# Patient Record
Sex: Male | Born: 1975 | Race: White | Hispanic: No | Marital: Married | Smoking: Never smoker
Health system: Southern US, Community
[De-identification: ages and names within clinical notes are randomized; demographics above are authoritative.]

## PROBLEM LIST (undated history)

## (undated) DIAGNOSIS — M109 Gout, unspecified: Secondary | ICD-10-CM

## (undated) HISTORY — DX: Gout, unspecified: M10.9

## (undated) HISTORY — PX: NO PAST SURGERIES: SHX2092

---

## 2020-05-09 ENCOUNTER — Encounter (HOSPITAL_COMMUNITY): Payer: Self-pay | Admitting: Emergency Medicine

## 2020-05-09 ENCOUNTER — Other Ambulatory Visit: Payer: Self-pay

## 2020-05-09 ENCOUNTER — Emergency Department (HOSPITAL_COMMUNITY): Payer: Managed Care, Other (non HMO)

## 2020-05-09 ENCOUNTER — Emergency Department (HOSPITAL_COMMUNITY)
Admission: EM | Admit: 2020-05-09 | Discharge: 2020-05-10 | Disposition: A | Discharge status: Home / Self Care | Payer: Managed Care, Other (non HMO) | Attending: Emergency Medicine | Admitting: Emergency Medicine

## 2020-05-09 DIAGNOSIS — E860 Dehydration: Secondary | ICD-10-CM | POA: Insufficient documentation

## 2020-05-09 DIAGNOSIS — R112 Nausea with vomiting, unspecified: Secondary | ICD-10-CM | POA: Diagnosis not present

## 2020-05-09 DIAGNOSIS — Z79899 Other long term (current) drug therapy: Secondary | ICD-10-CM | POA: Diagnosis not present

## 2020-05-09 DIAGNOSIS — R197 Diarrhea, unspecified: Secondary | ICD-10-CM | POA: Diagnosis not present

## 2020-05-09 DIAGNOSIS — U071 COVID-19: Secondary | ICD-10-CM | POA: Diagnosis not present

## 2020-05-09 DIAGNOSIS — R509 Fever, unspecified: Secondary | ICD-10-CM | POA: Diagnosis present

## 2020-05-09 LAB — CBC WITH DIFFERENTIAL/PLATELET
Abs Immature Granulocytes: 0.01 10*3/uL (ref 0.00–0.07)
Basophils Absolute: 0 10*3/uL (ref 0.0–0.1)
Basophils Relative: 0 %
Eosinophils Absolute: 0 10*3/uL (ref 0.0–0.5)
Eosinophils Relative: 0 %
HCT: 48.4 % (ref 39.0–52.0)
Hemoglobin: 15.1 g/dL (ref 13.0–17.0)
Immature Granulocytes: 0 %
Lymphocytes Relative: 24 %
Lymphs Abs: 0.9 10*3/uL (ref 0.7–4.0)
MCH: 25.6 pg — ABNORMAL LOW (ref 26.0–34.0)
MCHC: 31.2 g/dL (ref 30.0–36.0)
MCV: 82 fL (ref 80.0–100.0)
Monocytes Absolute: 0.2 10*3/uL (ref 0.1–1.0)
Monocytes Relative: 5 %
Neutro Abs: 2.8 10*3/uL (ref 1.7–7.7)
Neutrophils Relative %: 71 %
Platelets: 180 10*3/uL (ref 150–400)
RBC: 5.9 MIL/uL — ABNORMAL HIGH (ref 4.22–5.81)
RDW: 14.2 % (ref 11.5–15.5)
WBC: 3.9 10*3/uL — ABNORMAL LOW (ref 4.0–10.5)
nRBC: 0 % (ref 0.0–0.2)

## 2020-05-09 MED ORDER — ONDANSETRON HCL 4 MG/2ML IJ SOLN
4.0000 mg | Freq: Once | INTRAMUSCULAR | Status: AC
  Administered 2020-05-09: 4 mg via INTRAVENOUS
  Filled 2020-05-09: qty 2

## 2020-05-09 MED ORDER — SODIUM CHLORIDE 0.9% FLUSH
3.0000 mL | Freq: Once | INTRAVENOUS | Status: AC
  Administered 2020-05-09: 3 mL via INTRAVENOUS

## 2020-05-09 MED ORDER — ACETAMINOPHEN 325 MG PO TABS
650.0000 mg | ORAL_TABLET | Freq: Once | ORAL | Status: AC | PRN
  Administered 2020-05-09: 650 mg via ORAL
  Filled 2020-05-09: qty 2

## 2020-05-09 MED ORDER — SODIUM CHLORIDE 0.9 % IV BOLUS
1000.0000 mL | Freq: Once | INTRAVENOUS | Status: AC
  Administered 2020-05-09: 1000 mL via INTRAVENOUS

## 2020-05-09 NOTE — ED Provider Notes (Signed)
[facility] DEPT Provider Note: Georgena Spurling, MD, FACEP  CSN: [id number] MRN: [id number] ARRIVAL: 05/09/20 at 2227 ROOM: [place]  Fever   HISTORY OF PRESENT ILLNESS  05/09/20 11:18 PM Phillip Mosley is a 44 y.o. male with a 1 week history of fever, chills, cough, shortness of breath, nausea, vomiting and diarrhea.  Symptoms have persisted and he is feeling worse tonight.  Although he rated associated body aches is a 6 out of 10 in triage he tells me he does not have body aches.  He feels weak and dehydrated.  He has been taking Tylenol for his fever, last dose at 2 PM; he was given a dose on arrival.  He was diagnosed as Covid positive at [facility] 4 days ago.  He denies abdominal pain.  He is having some sore throat when he coughs.   History reviewed. No pertinent past medical history.  History reviewed. No pertinent surgical history.  No family history on file.  Social History   Tobacco Use  . Smoking status: Never Smoker  . Smokeless tobacco: Never Used  Substance Use Topics  . Alcohol use: Yes  . Drug use: Never    Prior to Admission medications   Medication Sig Start Date End Date Taking? Authorizing Provider  Ascorbic Acid (VITAMIN C) 100 MG tablet Take 100 mg by mouth daily.   Yes [provider]  cholecalciferol (VITAMIN D3) 25 MCG (1000 UNIT) tablet Take 1,000 Units by mouth daily.   Yes [provider]  magnesium 30 MG tablet Take 30 mg by mouth daily.   Yes [provider]  zinc sulfate 220 (50 Zn) MG capsule Take 220 mg by mouth daily.   Yes [provider]    Allergies Patient has no known allergies.   REVIEW OF SYSTEMS  Negative except as noted here or in the History of Present Illness.   PHYSICAL EXAMINATION  Initial Vital Signs Blood pressure (!) 142/86, pulse (!) 119, temperature (!) 102.1 F (38.9 C), temperature source Oral, resp. rate 17, height 5\' 10"  (1.778 m), weight 120.2 kg,  SpO2 98 %.  Examination General: Well-developed, well-nourished male in no acute distress; appearance consistent with age of record HENT: normocephalic; atraumatic Eyes: pupils equal, round and reactive to light; extraocular muscles intact Neck: supple Heart: regular rate and rhythm; tachycardia Lungs: Decreased air movement bilaterally; no tachypnea; no wheezing Abdomen: soft; nondistended; nontender; bowel sounds present Extremities: No deformity; full range of motion; pulses normal Neurologic: Awake, alert and oriented; motor function intact in all extremities and symmetric; no facial droop Skin: Warm and dry Psychiatric: Flat affect   RESULTS  Summary of this visit's results, reviewed and interpreted by myself:   EKG Interpretation  Date/Time:    Ventricular Rate:    PR Interval:    QRS Duration:   QT Interval:    QTC Calculation:   R Axis:     Text Interpretation:        Laboratory Studies: Results for orders placed or performed during the hospital encounter of 05/09/20 (from the past 24 hour(s))  Lactic acid, plasma     Status: None   Collection Time: 05/09/20 11:08 PM  Result Value Ref Range   Lactic Acid, Venous 1.1 0.5 - 1.9 mmol/L  Comprehensive metabolic panel     Status: Abnormal   Collection Time: 05/09/20 11:08 PM  Result Value Ref Range   Sodium 135 135 - 145 mmol/L   Potassium 4.6 3.5 - 5.1  mmol/L   Chloride 99 98 - 111 mmol/L   CO2 24 22 - 32 mmol/L   Glucose, Bld 103 (H) 70 - 99 mg/dL   BUN 18 6 - 20 mg/dL   Creatinine, Ser 1.21 0.61 - 1.24 mg/dL   Calcium 8.4 (L) 8.9 - 10.3 mg/dL   Total Protein 7.5 6.5 - 8.1 g/dL   Albumin 3.9 3.5 - 5.0 g/dL   AST 73 (H) 15 - 41 U/L   ALT 61 (H) 0 - 44 U/L   Alkaline Phosphatase 67 38 - 126 U/L   Total Bilirubin 0.8 0.3 - 1.2 mg/dL   GFR calc non Af Amer >60 >60 mL/min   GFR calc Af Amer >60 >60 mL/min   Anion gap 12 5 - 15  CBC with Differential     Status: Abnormal   Collection Time: 05/09/20 11:08 PM    Result Value Ref Range   WBC 3.9 (L) 4.0 - 10.5 K/uL   RBC 5.90 (H) 4.22 - 5.81 MIL/uL   Hemoglobin 15.1 13.0 - 17.0 g/dL   HCT 48.4 39.0 - 52.0 %   MCV 82.0 80.0 - 100.0 fL   MCH 25.6 (L) 26.0 - 34.0 pg   MCHC 31.2 30.0 - 36.0 g/dL   RDW 14.2 11.5 - 15.5 %   Platelets 180 150 - 400 K/uL   nRBC 0.0 0.0 - 0.2 %   Neutrophils Relative % 71 %   Neutro Abs 2.8 1.7 - 7.7 K/uL   Lymphocytes Relative 24 %   Lymphs Abs 0.9 0.7 - 4.0 K/uL   Monocytes Relative 5 %   Monocytes Absolute 0.2 0.1 - 1.0 K/uL   Eosinophils Relative 0 %   Eosinophils Absolute 0.0 0.0 - 0.5 K/uL   Basophils Relative 0 %   Basophils Absolute 0.0 0.0 - 0.1 K/uL   Immature Granulocytes 0 %   Abs Immature Granulocytes 0.01 0.00 - 0.07 K/uL  SARS Coronavirus 2 by RT PCR (hospital order, performed in [place] hospital lab) Nasopharyngeal Nasopharyngeal Swab     Status: Abnormal   Collection Time: 05/09/20 11:48 PM   Specimen: Nasopharyngeal Swab  Result Value Ref Range   SARS Coronavirus 2 POSITIVE (A) NEGATIVE   Imaging Studies: DG Chest Port 1 View  Result Date: 05/09/2020 CLINICAL DATA:  COVID-19 positive, nausea, vomiting, diarrhea for 5 days EXAM: PORTABLE CHEST 1 VIEW COMPARISON:  None. FINDINGS: Single frontal view of the chest demonstrates an unremarkable cardiac silhouette. Lung volumes are diminished, with no airspace disease, effusion, or pneumothorax. No acute bony abnormality. IMPRESSION: 1. Low lung volumes, no acute process. Electronically Signed   By: Randa Ngo M.D.   On: 05/09/2020 23:26    ED COURSE and MDM  Nursing notes, initial and subsequent vitals signs, including pulse oximetry, reviewed and interpreted by myself.  Vitals:   05/10/20 0000 05/10/20 0100 05/10/20 0215 05/10/20 0221  BP: 132/79 125/71 128/85   Pulse: (!) 101 92 93   Resp: (!) 21 (!) 26 (!) 27   Temp:   99 F (37.2 C)   TempSrc:      SpO2: 93% 94% 92% 95%  Weight:      Height:       Medications  albuterol  (VENTOLIN HFA) 108 (90 Base) MCG/ACT inhaler 2 puff (2 puffs Inhalation Given 05/10/20 0221)  sodium chloride flush (NS) 0.9 % injection 3 mL (3 mLs Intravenous Given 05/09/20 2346)  acetaminophen (TYLENOL) tablet 650 mg (650 mg Oral Given 05/09/20  2342)  ondansetron (ZOFRAN) injection 4 mg (4 mg Intravenous Given 05/09/20 2343)  sodium chloride 0.9 % bolus 1,000 mL (1,000 mLs Intravenous New Bag/Given 05/09/20 2342)  sodium chloride 0.9 % bolus 1,000 mL (1,000 mLs Intravenous New Bag/Given 05/10/20 0222)   3:28 AM Patient feeling better after 2 L bolus and IV Zofran.  Patient also given albuterol inhaler and instructed in its use.  He complains of a persistent cough but lungs remain clear and chest x-ray shows no infiltrates.  I do not believe admission is needed at this time.   PROCEDURES  Procedures   ED DIAGNOSES     ICD-10-CM   1. COVID-19  U07.1 DG Chest Port 1 View    DG Chest Port 1 View  2. Nausea vomiting and diarrhea  R11.2    R19.7   3. Dehydration  E86.0        Myking Sar, Jenny Reichmann, MD 05/10/20 [id number]

## 2020-05-09 NOTE — ED Triage Notes (Signed)
Pt presents with dx of COVID on Saturday and has been having fever, nausea, vomiting and diarrhea since then. Last tylenol at 1400.

## 2020-05-10 LAB — COMPREHENSIVE METABOLIC PANEL
ALT: 61 U/L — ABNORMAL HIGH (ref 0–44)
AST: 73 U/L — ABNORMAL HIGH (ref 15–41)
Albumin: 3.9 g/dL (ref 3.5–5.0)
Alkaline Phosphatase: 67 U/L (ref 38–126)
Anion gap: 12 (ref 5–15)
BUN: 18 mg/dL (ref 6–20)
CO2: 24 mmol/L (ref 22–32)
Calcium: 8.4 mg/dL — ABNORMAL LOW (ref 8.9–10.3)
Chloride: 99 mmol/L (ref 98–111)
Creatinine, Ser: 1.21 mg/dL (ref 0.61–1.24)
GFR calc Af Amer: >60 mL/min (ref >60)
GFR calc non Af Amer: >60 mL/min (ref >60)
Glucose, Bld: 103 mg/dL — ABNORMAL HIGH (ref 70–99)
Potassium: 4.6 mmol/L (ref 3.5–5.1)
Sodium: 135 mmol/L (ref 135–145)
Total Bilirubin: 0.8 mg/dL (ref 0.3–1.2)
Total Protein: 7.5 g/dL (ref 6.5–8.1)

## 2020-05-10 LAB — SARS CORONAVIRUS 2 BY RT PCR (HOSPITAL ORDER, PERFORMED IN ~~LOC~~ HOSPITAL LAB): SARS Coronavirus 2: POSITIVE — AB

## 2020-05-10 LAB — LACTIC ACID, PLASMA: Lactic Acid, Venous: 1.1 mmol/L (ref 0.5–1.9)

## 2020-05-10 MED ORDER — HYDROCOD POLST-CPM POLST ER 10-8 MG/5ML PO SUER
5.0000 mL | Freq: Once | ORAL | Status: AC
  Administered 2020-05-10: 5 mL via ORAL
  Filled 2020-05-10: qty 5

## 2020-05-10 MED ORDER — HYDROCOD POLST-CPM POLST ER 10-8 MG/5ML PO SUER: 5.0000 mL | Freq: Two times a day (BID) | ORAL | 0 refills | Status: DC | PRN

## 2020-05-10 MED ORDER — ALBUTEROL SULFATE HFA 108 (90 BASE) MCG/ACT IN AERS
2.0000 | INHALATION_SPRAY | RESPIRATORY_TRACT | Status: DC | PRN
  Administered 2020-05-10: 2 via RESPIRATORY_TRACT
  Filled 2020-05-10: qty 6.7

## 2020-05-10 MED ORDER — SODIUM CHLORIDE 0.9 % IV BOLUS
1000.0000 mL | Freq: Once | INTRAVENOUS | Status: AC
  Administered 2020-05-10: 1000 mL via INTRAVENOUS

## 2020-05-10 MED ORDER — ONDANSETRON 8 MG PO TBDP: 8.0000 mg | ORAL_TABLET | Freq: Three times a day (TID) | ORAL | 1 refills | Status: DC | PRN

## 2020-05-11 ENCOUNTER — Telehealth: Payer: Self-pay | Admitting: Nurse Practitioner

## 2020-05-11 ENCOUNTER — Other Ambulatory Visit: Payer: Self-pay | Admitting: Unknown Physician Specialty

## 2020-05-11 ENCOUNTER — Telehealth: Payer: Self-pay | Admitting: Unknown Physician Specialty

## 2020-05-11 NOTE — Telephone Encounter (Signed)
Called to discuss with patient about Covid symptoms and the use of bamlanivimab, a monoclonal antibody infusion for those with mild to moderate Covid symptoms and at a high risk of hospitalization.  Pt is qualified for this infusion at the [facility] infusion center due to BMI>35   Unable to leave message as mailbox is full.  Emergency number not in service

## 2020-05-11 NOTE — Telephone Encounter (Signed)
Called to discuss with Francene Castle about Covid symptoms and the use of bamlanivimab combination, a monoclonal antibody infusion for those with mild to moderate Covid symptoms and at a high risk of hospitalization.     Pt is qualified for this infusion at the [facility] infusion center due to co-morbid conditions and/or a member of an at-risk group (BMI >35).   Unable to reach patient, voicemail is full.   Alda Lea, AGPCNP-BC Pager: [phone] Amion: Bjorn Pippin

## 2021-04-22 IMAGING — DX DG CHEST 1V PORT
1 series · 1 of 1 positions shown · non-contrast
Comparison: None.

CLINICAL DATA: [id number] positive, nausea, vomiting, diarrhea for 5
days

EXAM:
PORTABLE CHEST 1 VIEW

[chest ap]
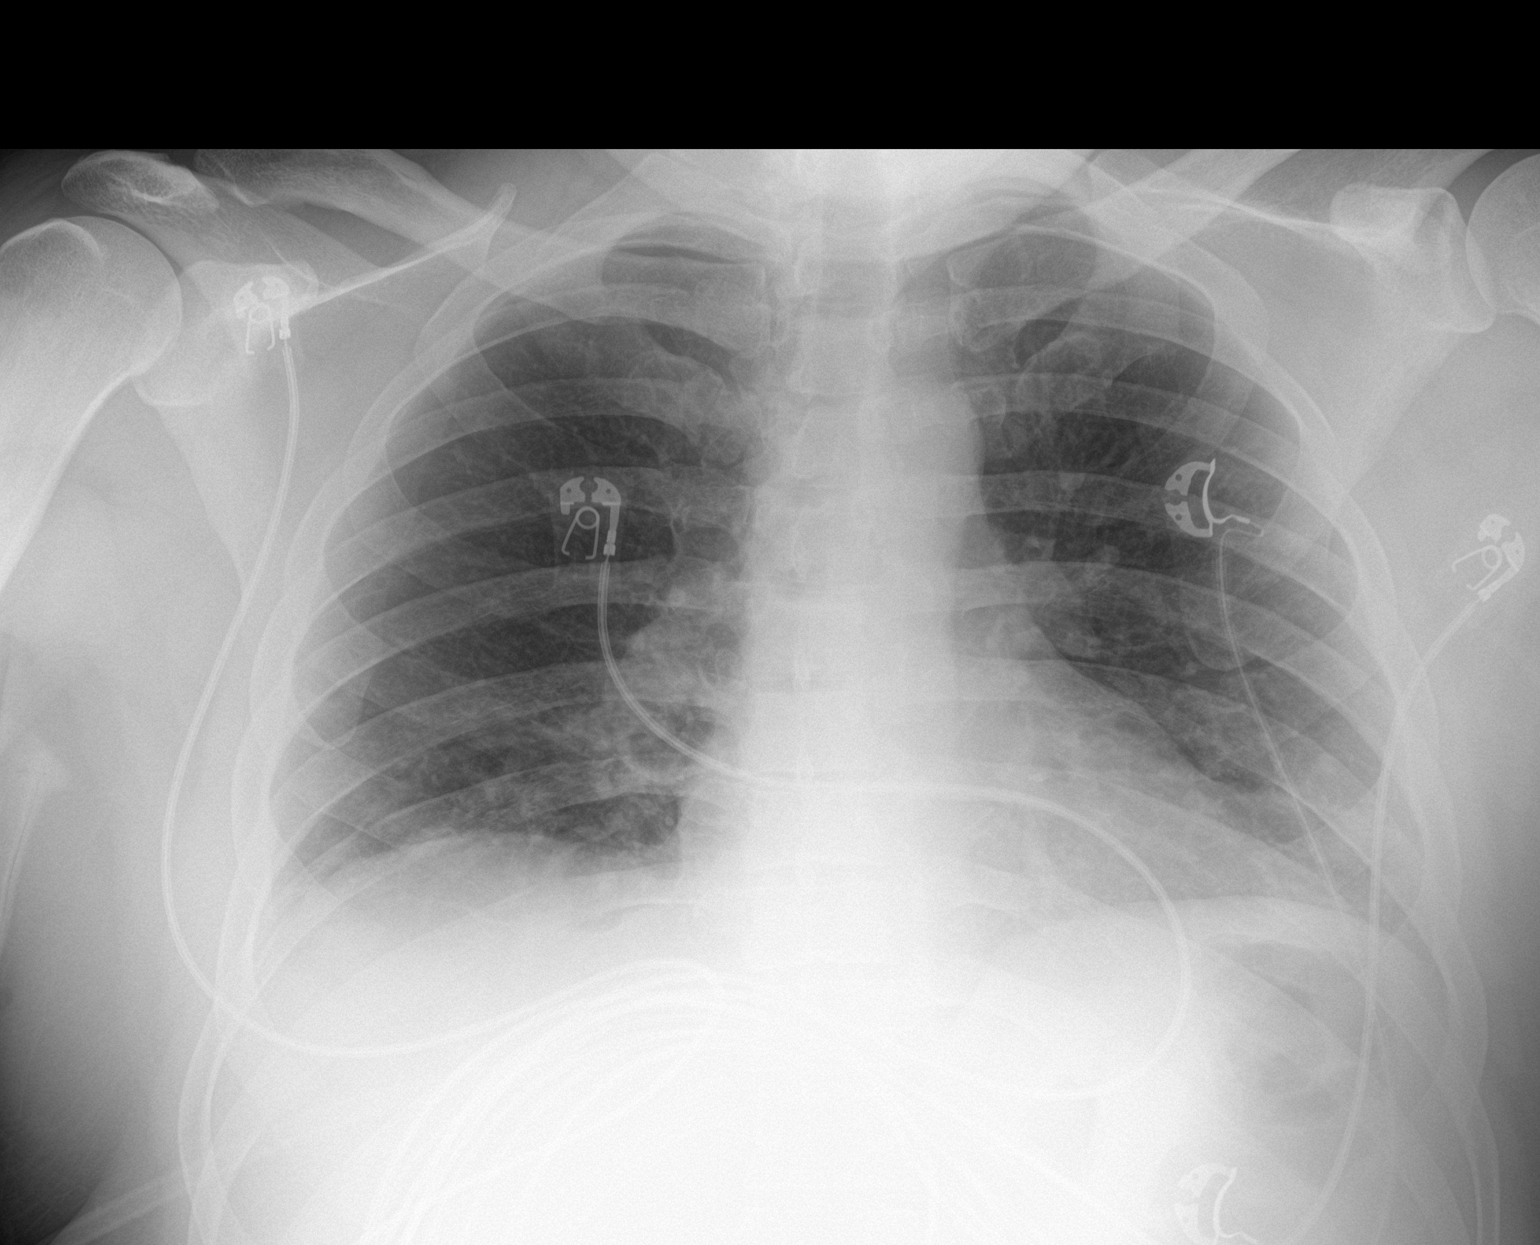

[1 of 1 positions shown; findings below may reference images not displayed]

FINDINGS: Single frontal view of the chest demonstrates an unremarkable
cardiac silhouette. Lung volumes are diminished, with no airspace
disease, effusion, or pneumothorax. No acute bony abnormality.
IMPRESSION: 1. Low lung volumes, no acute process.

## 2021-04-23 ENCOUNTER — Encounter: Payer: Self-pay | Admitting: Gastroenterology

## 2021-05-20 ENCOUNTER — Encounter: Payer: Self-pay | Admitting: Gastroenterology

## 2021-05-20 ENCOUNTER — Ambulatory Visit: Payer: Managed Care, Other (non HMO) | Admitting: Gastroenterology

## 2021-05-20 ENCOUNTER — Other Ambulatory Visit: Payer: Self-pay

## 2021-05-20 VITALS — BP 118/88 | HR 84 | Ht 70.0 in | Wt 268.8 lb

## 2021-05-20 DIAGNOSIS — K625 Hemorrhage of anus and rectum: Secondary | ICD-10-CM | POA: Diagnosis not present

## 2021-05-20 DIAGNOSIS — K5900 Constipation, unspecified: Secondary | ICD-10-CM | POA: Diagnosis not present

## 2021-05-20 MED ORDER — NA SULFATE-K SULFATE-MG SULF 17.5-3.13-1.6 GM/177ML PO SOLN: 1.0000 | Freq: Once | ORAL | 0 refills | Status: AC

## 2021-05-20 NOTE — Patient Instructions (Signed)
If you are age 43 or older, your body mass index should be between 23-30. Your Body mass index is 38.57 kg/m. If this is out of the aforementioned range listed, please consider follow up with your Primary Care Provider.  If you are age 48 or younger, your body mass index should be between 19-25. Your Body mass index is 38.57 kg/m. If this is out of the aformentioned range listed, please consider follow up with your Primary Care Provider.   You have been scheduled for a colonoscopy. Please follow written instructions given to you at your visit today.  Please pick up your prep supplies at the pharmacy within the next 1-3 days. If you use inhalers (even only as needed), please bring them with you on the day of your procedure.  Due to recent changes in healthcare laws, you may see the results of your imaging and laboratory studies on MyChart before your provider has had a chance to review them.  We understand that in some cases there may be results that are confusing or concerning to you. Not all laboratory results come back in the same time frame and the provider may be waiting for multiple results in order to interpret others.  Please give [place] 48 hours in order for your provider to thoroughly review all the results before contacting the office for clarification of your results.   The [facility] providers would like to encourage you to use [facility] to communicate with providers for non-urgent requests or questions.  Due to long hold times on the telephone, sending your provider a message by [facility] may be a faster and more efficient way to get a response.  Please allow 48 business hours for a response.  Please remember that this is for non-urgent requests.

## 2021-05-20 NOTE — Progress Notes (Signed)
05/20/2021 Phillip Mosley [id number] 07/10/1976   HISTORY OF PRESENT ILLNESS: This is a 45 year old male who is new to our office.  He has been referred here by Shanon Rosser, PA-C, for evaluation regarding rectal bleeding.  He tells me that he has been seeing bright red color blood in his stools/with bowel movements for the past 6 months.  He says that he does have to strain to move his bowels and has a bowel movement every couple of days.  He started stool softeners a few weeks ago and they seem to help to some degree.  He has never had colonoscopy in the past.  Recent CBC showed a normal hemoglobin at 14.6 g.  He denies any abdominal pain.  He has family history of colon cancer in his maternal grandfather.   Past Medical History:  Diagnosis Date  . Gout    Past Surgical History:  Procedure Laterality Date  . NO PAST SURGERIES      reports that he has never smoked. He has never used smokeless tobacco. He reports current alcohol use. He reports that he does not use drugs. family history includes Colon cancer in his maternal grandfather. No Known Allergies    Outpatient Encounter Medications as of 05/20/2021  Medication Sig  . allopurinol (ZYLOPRIM) 100 MG tablet Take 100 mg by mouth daily.  Mariane Baumgarten Sodium (STOOL SOFTENER LAXATIVE PO) Take 1 capsule by mouth as needed.  . [DISCONTINUED] Ascorbic Acid (VITAMIN C) 100 MG tablet Take 100 mg by mouth daily.  . [DISCONTINUED] chlorpheniramine-HYDROcodone (TUSSIONEX PENNKINETIC ER) 10-8 MG/5ML SUER Take 5 mLs by mouth every 12 (twelve) hours as needed for cough.  . [DISCONTINUED] cholecalciferol (VITAMIN D3) 25 MCG (1000 UNIT) tablet Take 1,000 Units by mouth daily.  . [DISCONTINUED] magnesium 30 MG tablet Take 30 mg by mouth daily.  . [DISCONTINUED] ondansetron (ZOFRAN ODT) 8 MG disintegrating tablet Take 1 tablet (8 mg total) by mouth every 8 (eight) hours as needed for nausea or vomiting.  . [DISCONTINUED] zinc sulfate 220 (50 Zn)  MG capsule Take 220 mg by mouth daily.   No facility-administered encounter medications on file as of 05/20/2021.    REVIEW OF SYSTEMS  : All other systems reviewed and negative except where noted in the History of Present Illness.   PHYSICAL EXAM: BP 118/88   Pulse 84   Ht 5\' 10"  (1.778 m)   Wt 268 lb 12.8 oz (121.9 kg)   SpO2 99%   BMI 38.57 kg/m  General: Well developed white male in no acute distress Head: Normocephalic and atraumatic Eyes:  Sclerae anicteric, conjunctiva pink. Ears: Normal auditory acuity Lungs: Clear throughout to auscultation; no W/R/R. Heart: Regular rate and rhythm; no M/R/G. Abdomen: Soft, non-distended.  BS present.  Non-tender. Rectal:  Will be done at the time of colonoscopy. Musculoskeletal: Symmetrical with no gross deformities  Skin: No lesions on visible extremities Extremities: No edema  Neurological: Alert oriented x 4, grossly non-focal Psychological:  Alert and cooperative. Normal mood and affect  ASSESSMENT AND PLAN: *Rectal bleeding: Sees frequent bright red blood with bowel movements.  Sounds like outlet bleeding, possibly internal hemorrhoids.  We will plan for colonoscopy to rule out other etiologies as well.  This is being scheduled with Dr. Lyndel Safe.  The risks, benefits, and alternatives to colonoscopy were discussed with the patient and he consents to proceed.  *Constipation: Continue stool softeners if they seem to be helping.  We also discussed increasing water/liquids in his diet  and fruits/vegetables such as salads and "P" fruits including plums, peaches, pears.   CC:  Long, Del Muerto, PA-C

## 2021-05-29 NOTE — Progress Notes (Signed)
Agree with assessment/plan RG 

## 2021-05-31 ENCOUNTER — Ambulatory Visit (AMBULATORY_SURGERY_CENTER): Payer: Managed Care, Other (non HMO) | Admitting: Gastroenterology

## 2021-05-31 ENCOUNTER — Other Ambulatory Visit: Payer: Self-pay

## 2021-05-31 ENCOUNTER — Encounter: Payer: Self-pay | Admitting: Gastroenterology

## 2021-05-31 VITALS — BP 117/75 | HR 69 | Temp 97.7°F | Resp 17 | Ht 70.0 in | Wt 268.0 lb

## 2021-05-31 DIAGNOSIS — K648 Other hemorrhoids: Secondary | ICD-10-CM

## 2021-05-31 DIAGNOSIS — K5900 Constipation, unspecified: Secondary | ICD-10-CM | POA: Diagnosis not present

## 2021-05-31 DIAGNOSIS — D127 Benign neoplasm of rectosigmoid junction: Secondary | ICD-10-CM

## 2021-05-31 DIAGNOSIS — K625 Hemorrhage of anus and rectum: Secondary | ICD-10-CM

## 2021-05-31 MED ORDER — SODIUM CHLORIDE 0.9 % IV SOLN
500.0000 mL | Freq: Once | INTRAVENOUS | Status: DC
Start: 2021-05-31 — End: 2021-05-31

## 2021-05-31 NOTE — Op Note (Signed)
[facility] Patient Name: Phillip Mosley Procedure Date: 05/31/2021 10:40 AM MRN: [id number] Endoscopist: Jackquline Denmark , MD Age: 45 Referring MD:  Date of Birth: 12-04-1976 Gender: Male Account #: [id number] Procedure:                Colonoscopy Indications:              Rectal bleeding. H/O constipation. Family history                            of colon cancer in a second-degree relative. Medicines:                Monitored Anesthesia Care Procedure:                Pre-Anesthesia Assessment:                           - Prior to the procedure, a History and Physical                            was performed, and patient medications and                            allergies were reviewed. The patient's tolerance of                            previous anesthesia was also reviewed. The risks                            and benefits of the procedure and the sedation                            options and risks were discussed with the patient.                            All questions were answered, and informed consent                            was obtained. Prior Anticoagulants: The patient has                            taken no previous anticoagulant or antiplatelet                            agents. ASA Grade Assessment: II - A patient with                            mild systemic disease. After reviewing the risks                            and benefits, the patient was deemed in                            satisfactory condition to undergo the procedure.  After obtaining informed consent, the colonoscope                            was passed under direct vision. Throughout the                            procedure, the patient's blood pressure, pulse, and                            oxygen saturations were monitored continuously. The                            Olympus [id number] (#[id number]) Colonoscope was                            introduced through the  anus and advanced to the 2                            cm into the ileum. The colonoscopy was performed                            without difficulty. The patient tolerated the                            procedure well. The quality of the bowel                            preparation was good. The terminal ileum, ileocecal                            valve, appendiceal orifice, and rectum were                            photographed. Scope In: 10:50:35 AM Scope Out: 11:01:18 AM Scope Withdrawal Time: 0 hours 7 minutes 8 seconds  Total Procedure Duration: 0 hours 10 minutes 43 seconds  Findings:                 A 10 mm polyp was found in the recto-sigmoid colon.                            The polyp was semi-sessile. The polyp was removed                            with a hot snare. Resection and retrieval were                            complete.                           Internal hemorrhoids were found during                            retroflexion. The hemorrhoids were small.  The terminal ileum appeared normal.                           The exam was otherwise without abnormality on                            direct and retroflexion views. Complications:            No immediate complications. Estimated Blood Loss:     Estimated blood loss: none. Impression:               - One 10 mm polyp at the recto-sigmoid colon,                            removed with a hot snare. Resected and retrieved.                           - Internal hemorrhoids (likely etiology of rectal                            bleeding).                           - The examined portion of the ileum was normal.                           - The examination was otherwise normal on direct                            and retroflexion views. Recommendation:           - Patient has a contact number available for                            emergencies. The signs and symptoms of potential                             delayed complications were discussed with the                            patient. Return to normal activities tomorrow.                            Written discharge instructions were provided to the                            patient.                           - High fiber diet.                           - Colace OTC capsule(s) orally 100 mg daily.                           - Await pathology results.                           -  Repeat colonoscopy for surveillance based on                            pathology results.                           - Preparation H 1 twice daily after the bowel                            movement for 7 to 10 days PRN                           - The findings and recommendations were discussed                            with the patient's family. Jackquline Denmark, MD 05/31/2021 11:06:54 AM This report has been signed electronically.

## 2021-05-31 NOTE — Progress Notes (Signed)
Called to room to assist during endoscopic procedure.  Patient ID and intended procedure confirmed with present staff. Received instructions for my participation in the procedure from the performing physician.  

## 2021-05-31 NOTE — Progress Notes (Signed)
PT taken to PACU. Monitors in place. VSS. Report given to RN. 

## 2021-05-31 NOTE — Patient Instructions (Signed)
Take colace OTC 100 mg daily.  Use preparation H twice daily after a BM for 7-10 days.  YOU HAD AN ENDOSCOPIC PROCEDURE TODAY AT [facility]:   Refer to the procedure report that was given to you for any specific questions about what was found during the examination.  If the procedure report does not answer your questions, please call your gastroenterologist to clarify.  If you requested that your care partner not be given the details of your procedure findings, then the procedure report has been included in a sealed envelope for you to review at your convenience later.  YOU SHOULD EXPECT: Some feelings of bloating in the abdomen. Passage of more gas than usual.  Walking can help get rid of the air that was put into your GI tract during the procedure and reduce the bloating. If you had a lower endoscopy (such as a colonoscopy or flexible sigmoidoscopy) you may notice spotting of blood in your stool or on the toilet paper. If you underwent a bowel prep for your procedure, you may not have a normal bowel movement for a few days.  Please Note:  You might notice some irritation and congestion in your nose or some drainage.  This is from the oxygen used during your procedure.  There is no need for concern and it should clear up in a day or so.  SYMPTOMS TO REPORT IMMEDIATELY:   Following lower endoscopy (colonoscopy or flexible sigmoidoscopy):  Excessive amounts of blood in the stool  Significant tenderness or worsening of abdominal pains  Swelling of the abdomen that is new, acute  Fever of 100F or higher  For urgent or emergent issues, a gastroenterologist can be reached at any hour by calling [phone]. Do not use MyChart messaging for urgent concerns.    DIET:  We do recommend a small meal at first, but then you may proceed to your regular diet.  Drink plenty of fluids but you should avoid alcoholic beverages for 24 hours. Increase the fiber in your diet, and drink plenty  of water.  ACTIVITY:  You should plan to take it easy for the rest of today and you should NOT DRIVE or use heavy machinery until tomorrow (because of the sedation medicines used during the test).    FOLLOW UP: Our staff will call the number listed on your records 48-72 hours following your procedure to check on you and address any questions or concerns that you may have regarding the information given to you following your procedure. If we do not reach you, we will leave a message.  We will attempt to reach you two times.  During this call, we will ask if you have developed any symptoms of COVID 19. If you develop any symptoms (ie: fever, flu-like symptoms, shortness of breath, cough etc.) before then, please call [phone].  If you test positive for Covid 19 in the 2 weeks post procedure, please call and report this information to [place].    If any biopsies were taken you will be contacted by phone or by letter within the next 1-3 weeks.  Please call us at [phone] if you have not heard about the biopsies in 3 weeks.    SIGNATURES/CONFIDENTIALITY: You and/or your care partner have signed paperwork which will be entered into your electronic medical record.  These signatures attest to the fact that that the information above on your After Visit Summary has been reviewed and is understood.  Full responsibility of the  confidentiality of this discharge information lies with you and/or your care-partner. 

## 2021-05-31 NOTE — Progress Notes (Signed)
Vs by cw

## 2021-06-04 ENCOUNTER — Telehealth: Payer: Self-pay | Admitting: *Deleted

## 2021-06-04 NOTE — Telephone Encounter (Signed)
  Follow up Call-  Call back number 05/31/2021  Post procedure Call Back phone  # [phone]  Permission to leave phone message Yes     Patient questions:  Do you have a fever, pain , or abdominal swelling? No. Pain Score  0 *  Have you tolerated food without any problems? Yes.    Have you been able to return to your normal activities? Yes.    Do you have any questions about your discharge instructions: Diet   No. Medications  No. Follow up visit  No.  Do you have questions or concerns about your Care? No.  Actions: * If pain score is 4 or above: No action needed, pain <4.  1. Have you developed a fever since your procedure? no  2.   Have you had an respiratory symptoms (SOB or cough) since your procedure? no  3.   Have you tested positive for COVID 19 since your procedure no  4.   Have you had any family members/close contacts diagnosed with the COVID 19 since your procedure? no   If yes to any of these questions please route to Joylene John, RN and Joella Prince, RN

## 2021-06-13 ENCOUNTER — Encounter: Payer: Self-pay | Admitting: Gastroenterology
